# Patient Record
Sex: Male | Born: 1960 | Race: White | Hispanic: No | Marital: Married | Smoking: Never smoker
Health system: Southern US, Community
[De-identification: ages and names within clinical notes are randomized; demographics above are authoritative.]

## PROBLEM LIST (undated history)

## (undated) DIAGNOSIS — E785 Hyperlipidemia, unspecified: Secondary | ICD-10-CM

## (undated) DIAGNOSIS — I2510 Atherosclerotic heart disease of native coronary artery without angina pectoris: Secondary | ICD-10-CM

## (undated) DIAGNOSIS — I10 Essential (primary) hypertension: Secondary | ICD-10-CM

## (undated) HISTORY — DX: Hyperlipidemia, unspecified: E78.5

## (undated) HISTORY — DX: Essential (primary) hypertension: I10

## (undated) HISTORY — DX: Atherosclerotic heart disease of native coronary artery without angina pectoris: I25.10

## (undated) HISTORY — PX: TONSILLECTOMY: SUR1361

## (undated) HISTORY — PX: INGUINAL HERNIA REPAIR: SUR1180

---

## 2004-12-27 ENCOUNTER — Inpatient Hospital Stay (HOSPITAL_COMMUNITY): Admission: EM | Admit: 2004-12-27 | Discharge: 2004-12-27 | Payer: Self-pay | Admitting: Emergency Medicine

## 2005-09-11 ENCOUNTER — Ambulatory Visit (HOSPITAL_COMMUNITY): Admission: RE | Admit: 2005-09-11 | Discharge: 2005-09-11 | Payer: Self-pay | Admitting: General Surgery

## 2017-11-27 ENCOUNTER — Emergency Department (HOSPITAL_COMMUNITY): Payer: BC Managed Care – PPO

## 2017-11-27 ENCOUNTER — Emergency Department (HOSPITAL_COMMUNITY)
Admission: EM | Admit: 2017-11-27 | Discharge: 2017-11-27 | Disposition: A | Discharge status: Home / Self Care | Payer: BC Managed Care – PPO | Attending: Emergency Medicine | Admitting: Emergency Medicine

## 2017-11-27 ENCOUNTER — Encounter (HOSPITAL_COMMUNITY): Payer: Self-pay

## 2017-11-27 DIAGNOSIS — R1031 Right lower quadrant pain: Secondary | ICD-10-CM | POA: Diagnosis present

## 2017-11-27 DIAGNOSIS — Z79899 Other long term (current) drug therapy: Secondary | ICD-10-CM | POA: Diagnosis not present

## 2017-11-27 DIAGNOSIS — R1030 Lower abdominal pain, unspecified: Secondary | ICD-10-CM

## 2017-11-27 LAB — URINALYSIS, ROUTINE W REFLEX MICROSCOPIC
BILIRUBIN URINE: NEGATIVE
Bacteria, UA: NONE SEEN
GLUCOSE, UA: NEGATIVE mg/dL
KETONES UR: NEGATIVE mg/dL
LEUKOCYTES UA: NEGATIVE
Nitrite: NEGATIVE
PROTEIN: NEGATIVE mg/dL
Specific Gravity, Urine: 1.014 (ref 1.005–1.030)
pH: 6 (ref 5.0–8.0)

## 2017-11-27 LAB — CBC
HCT: 44.1 % (ref 39.0–52.0)
Hemoglobin: 14.5 g/dL (ref 13.0–17.0)
MCH: 31.9 pg (ref 26.0–34.0)
MCHC: 32.9 g/dL (ref 30.0–36.0)
MCV: 96.9 fL (ref 80.0–100.0)
NRBC: 0 % (ref 0.0–0.2)
PLATELETS: 242 10*3/uL (ref 150–400)
RBC: 4.55 MIL/uL (ref 4.22–5.81)
RDW: 11.4 % — ABNORMAL LOW (ref 11.5–15.5)
WBC: 7.8 10*3/uL (ref 4.0–10.5)

## 2017-11-27 LAB — LIPASE, BLOOD: Lipase: 32 U/L (ref 11–51)

## 2017-11-27 LAB — COMPREHENSIVE METABOLIC PANEL
ALK PHOS: 45 U/L (ref 38–126)
ALT: 38 U/L (ref 0–44)
ANION GAP: 10 (ref 5–15)
AST: 28 U/L (ref 15–41)
Albumin: 4.4 g/dL (ref 3.5–5.0)
BUN: 16 mg/dL (ref 6–20)
CALCIUM: 9.6 mg/dL (ref 8.9–10.3)
CO2: 25 mmol/L (ref 22–32)
Chloride: 104 mmol/L (ref 98–111)
Creatinine, Ser: 0.66 mg/dL (ref 0.61–1.24)
GFR calc non Af Amer: >60 mL/min (ref >60)
Glucose, Bld: 112 mg/dL — ABNORMAL HIGH (ref 70–99)
Potassium: 3.7 mmol/L (ref 3.5–5.1)
SODIUM: 139 mmol/L (ref 135–145)
TOTAL PROTEIN: 7.4 g/dL (ref 6.5–8.1)
Total Bilirubin: 0.8 mg/dL (ref 0.3–1.2)

## 2017-11-27 MED ORDER — METHOCARBAMOL 500 MG PO TABS: 500.0000 mg | ORAL_TABLET | Freq: Two times a day (BID) | ORAL | 0 refills | Status: AC

## 2017-11-27 NOTE — ED Provider Notes (Signed)
Care assumed from Elpidio Anis, PA-C at shift change with CT pending.   In brief, this patient is a 57 y.o. M with PMH/o HLD, HTN who presents for evaluation of right sided abdominal pain that began yesterday. No associated fevers, nausea/vomiting. Please see note from previous provider for full history/physical.    PLAN: Labs reassuring. Normal GU exam. He is pending a CT renal study. If normal and exam reassuring, can be discharged home.   MDM:  CT renal study shows no obvious kidney stone.  There is mention of a cyst in the anterior mid right kidney.  No evidence of bowel obstruction.  Normal appendix.  Additionally, there is a 4 mm nodular opacity noted in the right lower lobe.  Recommends getting a follow-up CT in 1 year.  Discussed results with patient.  He states he has not had any improvement in pain but states that it is the same.  He denied all offers for pain medications.  He states that the pain is worse with movement and with palpation of the area.  Repeat abdominal exam shows mild tenderness noted in the right abdomen diffusely.  No focal point.  No palpable mass would be concerning for ventral hernia.  He has equal pulses in all 4 extremities.  Pain reproduced with palpation.  At this time, do not suspect dissection as the source of patient's pain.  Discussed results with both patient and wife.  Wife reports that prior to onset of symptoms, patient had been doing a lot of handy work around the house and had been working on a project that required a lot of lifting and movement.  We discussed that this could potentially be a muscle strain given her recent history.  Patient overall is well-appearing and in no acute distress.  Stable for discharge at this time.  We will plan to send home a short course of muscle relaxers to help with pain.  Instructed patient to closely monitor symptoms and return the emergency room for any worsening or concerning symptoms.  He does have an appointment this  primary care doctor next week.  Encouraged him to keep that appointment. Patient had ample opportunity for questions and discussion. All patient's questions were answered with full understanding. Strict return precautions discussed. Patient expresses understanding and agreement to plan.    1. Right lower quadrant abdominal pain   2. Lower abdominal pain       Maxwell Caul, PA-C 11/27/17 0930    Virgina Norfolk, DO 11/27/17 2028

## 2017-11-27 NOTE — ED Notes (Signed)
Pt. Requested not to insert IV if its not necessary . Did not request for any pain med either.

## 2017-11-27 NOTE — ED Triage Notes (Signed)
Pt complains of right lower abdominal pain near the umbilicus, he states the pain was dull yesterday and tonight it has become worse and more constant

## 2017-11-27 NOTE — Discharge Instructions (Addendum)
You can take Tylenol or Ibuprofen as directed for pain. You can alternate Tylenol and Ibuprofen every 4 hours. If you take Tylenol at 1pm, then you can take Ibuprofen at 5pm. Then you can take Tylenol again at 9pm.   Take Robaxin as prescribed. This medication will make you drowsy so do not drive or drink alcohol when taking it.  As we discussed, please follow-up with your primary care doctor as previously scheduled next week.  As we discussed, your CT scan showed a small cyst on the right kidney.  Please follow-up with urology regarding this.  Additionally, there is a 4 mm nodule in the right lower lobe of the lung.  This will need further evaluation from a CT scan in 1 year to measure progression.  As we discussed, closely monitor your symptoms.  Return to emergency department immediately if you start having worsening pain if you have fever, nausea/vomiting, chest pain, difficulty breathing or any other worsening or concerning symptoms.

## 2017-11-27 NOTE — ED Provider Notes (Signed)
[facility]-EMERGENCY DEPT Provider Note   CSN: [id number] Arrival date & time: 11/27/17  0253     History   Chief Complaint Chief Complaint  Patient presents with  . Abdominal Pain    HPI Harry Mcknight is a 57 y.o. male.  Patient with history of HTN, HLD presents with concern for non-radiating RLQ abdominal pain that started yesterday. He states the pain has become progressively worse over time to the point where movement makes the pain worse. No nausea, vomiting or diarrhea. No fever. He states he has been having regular bowel movements per his usual without constipation, and that having a bowel movement this morning did not change the character of the pain. No urinary symptoms or groin pain. No flank or back pain.  The history is provided by the patient and the spouse. No language interpreter was used.  Abdominal Pain   Pertinent negatives include fever, diarrhea, nausea, vomiting and dysuria.    History reviewed. No pertinent past medical history.  There are no active problems to display for this patient.   History reviewed. No pertinent surgical history.      Home Medications    Prior to Admission medications   Medication Sig Start Date End Date Taking? Authorizing Provider  acetaminophen (TYLENOL) 500 MG tablet Take 1,000 mg by mouth every 6 (six) hours as needed for moderate pain.   Yes [provider]  Cholecalciferol (DIALYVITE VITAMIN D 5000) 5000 units capsule Take 5,000 Units by mouth daily.   Yes [provider]  ibuprofen (ADVIL,MOTRIN) 200 MG tablet Take 400 mg by mouth every 6 (six) hours as needed for moderate pain.   Yes [provider]  lisinopril-hydrochlorothiazide (PRINZIDE,ZESTORETIC) 20-12.5 MG tablet Take 1 tablet by mouth daily. 08/30/17  Yes [provider]  metFORMIN (GLUCOPHAGE-XR) 500 MG 24 hr tablet Take 500 mg by mouth 2 (two) times daily. 09/05/17  Yes [provider]    Multiple Vitamin (MULTIVITAMIN WITH MINERALS) TABS tablet Take 1 tablet by mouth daily.   Yes [provider]  Omega-3 Fatty Acids (FISH OIL) 500 MG CAPS Take 1,000 mg by mouth daily.   Yes [provider]  rosuvastatin (CRESTOR) 40 MG tablet Take 40 mg by mouth daily. 09/02/17  Yes [provider]    Family History History reviewed. No pertinent family history.  Social History Social History   Tobacco Use  . Smoking status: Never Smoker  . Smokeless tobacco: Never Used  Substance Use Topics  . Alcohol use: Never    Frequency: Never  . Drug use: Never     Allergies   Patient has no known allergies.   Review of Systems Review of Systems  Constitutional: Negative for appetite change, chills and fever.  HENT: Negative.   Respiratory: Negative.   Cardiovascular: Negative.   Gastrointestinal: Positive for abdominal pain. Negative for diarrhea, nausea and vomiting.  Genitourinary: Negative.  Negative for dysuria and testicular pain.  Musculoskeletal: Negative.   Skin: Negative.   Neurological: Negative.      Physical Exam Updated Vital Signs BP (!) 151/97 (BP Location: Left Arm)   Pulse 68   Temp 98.2 F (36.8 C) (Oral)   Resp 16   SpO2 98%   Physical Exam  Constitutional: He is oriented to person, place, and time. He appears well-developed and well-nourished.  HENT:  Head: Normocephalic.  Neck: Normal range of motion. Neck supple.  Cardiovascular: Normal rate and regular rhythm.  Pulmonary/Chest: Effort normal and breath  sounds normal.  Abdominal: Soft. Bowel sounds are normal. There is no tenderness. There is no rebound and no guarding.  Pain in the RLQ abdomen is unchanged and unaffected by palpation. Abdomen is soft, without mass or distension. BS are active. Thre is no RUQ tenderness.  Genitourinary: Testes normal. Right testis shows no swelling and no tenderness. Left testis shows no swelling and no tenderness.  Musculoskeletal:  Normal range of motion.  Neurological: He is alert and oriented to person, place, and time. No cranial nerve deficit.  Skin: Skin is warm and dry. No rash noted.  Psychiatric: He has a normal mood and affect.  Nursing note and vitals reviewed.    ED Treatments / Results  Labs (all labs ordered are listed, but only abnormal results are displayed) Labs Reviewed  COMPREHENSIVE METABOLIC PANEL - Abnormal; Notable for the following components:      Result Value   Glucose, Bld 112 (*)    All other components within normal limits  CBC - Abnormal; Notable for the following components:   RDW 11.4 (*)    All other components within normal limits  URINALYSIS, ROUTINE W REFLEX MICROSCOPIC - Abnormal; Notable for the following components:   Hgb urine dipstick SMALL (*)    All other components within normal limits  LIPASE, BLOOD    EKG None  Radiology No results found.  Procedures Procedures (including critical care time)  Medications Ordered in ED Medications - No data to display   Initial Impression / Assessment and Plan / ED Course  I have reviewed the triage vital signs and the nursing notes.  Pertinent labs & imaging results that were available during my care of the patient were reviewed by me and considered in my medical decision making (see chart for details).     Patient presents with RLQ abdominal pain concerned for appendicitis. No fever, nausea, vomiting, diarrhea or change in appetite. Pain started yesterday.   The patient has no significant or reproducible tenderness in the abdomen. No leukocytosis, fever, nausea. He does have a small amount of blood in the urine. Doubt appendicitis. DDX: ureteral colic vs constipation. CT scan w/o CM ordered and is pending.   Patient care signed out to Gwendalyn Ege, PA-C, pending CT scan results and patient re-evaluation.  Final Clinical Impressions(s) / ED Diagnoses   Final diagnoses:  None   1. Abdominal pain  ED  Discharge Orders    None       Elpidio Anis, PA-C 11/27/17 6045    Gilda Crease, MD 11/27/17 [phone]

## 2019-04-16 ENCOUNTER — Ambulatory Visit: Payer: BC Managed Care – PPO | Attending: Internal Medicine

## 2019-04-16 DIAGNOSIS — Z23 Encounter for immunization: Secondary | ICD-10-CM

## 2019-04-16 NOTE — Progress Notes (Signed)
   Covid-19 Vaccination Clinic  Name:  Harry Mcknight    MRN: [id number] DOB: Jun 07, 1960  04/16/2019  [name] was observed post Covid-19 immunization for 15 minutes without incident. He was provided with Vaccine Information Sheet and instruction to access the V-Safe system.   [name] was instructed to call 911 with any severe reactions post vaccine: Marland Kitchen Difficulty breathing  . Swelling of face and throat  . A fast heartbeat  . A bad rash all over body  . Dizziness and weakness   Immunizations Administered    Name Date Dose VIS Date Route   Pfizer COVID-19 Vaccine 04/16/2019  9:48 AM 0.3 mL 01/16/2019 Intramuscular   Manufacturer: [employer], [employer]   Lot: [id number]   NDC: [address]-7

## 2019-05-11 ENCOUNTER — Ambulatory Visit: Payer: BC Managed Care – PPO | Attending: Internal Medicine

## 2019-05-11 DIAGNOSIS — Z23 Encounter for immunization: Secondary | ICD-10-CM

## 2019-05-11 NOTE — Progress Notes (Signed)
   Covid-19 Vaccination Clinic  Name:  Harry Mcknight    MRN: [id number] DOB: October 05, 1960  05/11/2019  [name] was observed post Covid-19 immunization for 15 minutes without incident. He was provided with Vaccine Information Sheet and instruction to access the V-Safe system.   [name] was instructed to call 911 with any severe reactions post vaccine: Marland Kitchen Difficulty breathing  . Swelling of face and throat  . A fast heartbeat  . A bad rash all over body  . Dizziness and weakness   Immunizations Administered    Name Date Dose VIS Date Route   Pfizer COVID-19 Vaccine 05/11/2019  9:39 AM 0.3 mL 01/16/2019 Intramuscular   Manufacturer: [employer], [employer]   Lot: [id number]   NDC: [address]-0

## 2020-02-13 IMAGING — CT CT RENAL STONE PROTOCOL
2 of 4 series · 15 of 46 positions shown, 17 images · non-contrast
Comparison: None.

CLINICAL DATA: Abdominal pain with microhematuria

EXAM:
CT ABDOMEN AND PELVIS WITHOUT CONTRAST
TECHNIQUE: Multidetector CT imaging of the abdomen and pelvis was performed
following the standard protocol without oral or IV contrast.

[Series 2: axial st · axial · 0.71mm/px · z∈[+1159,+1569]mm · 12 of 92 slices shown, 14 images]
[im 5/92  soft-tissue]
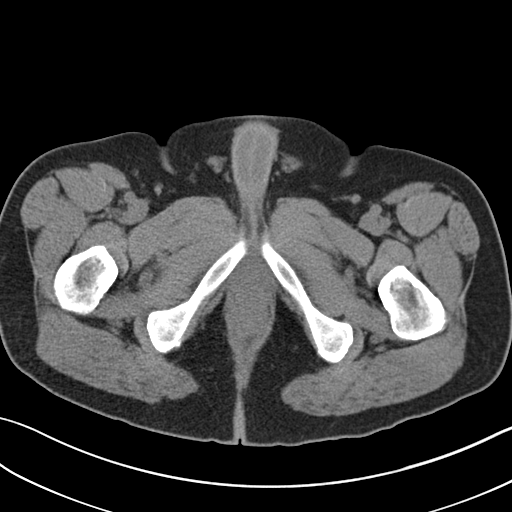
[im 5/92  bone]
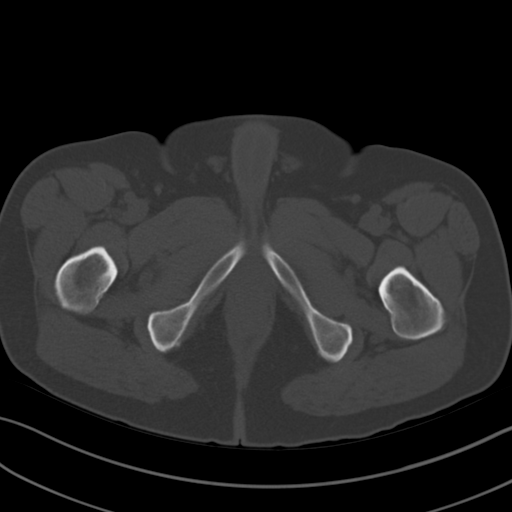
[im 15/92  soft-tissue]
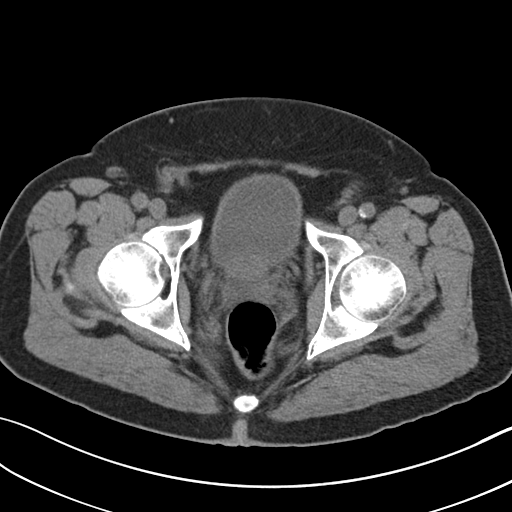
[im 20/92  soft-tissue]
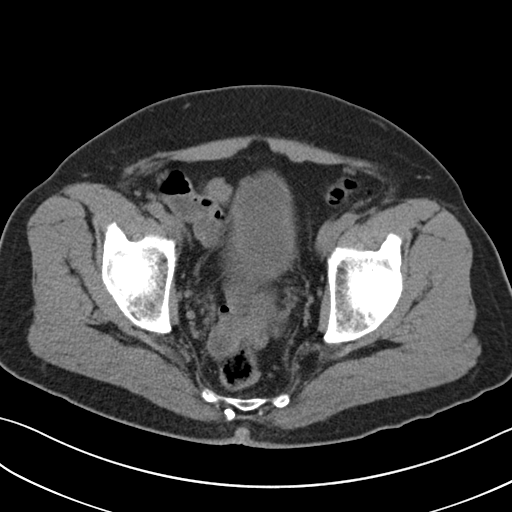
[im 29/92  soft-tissue]
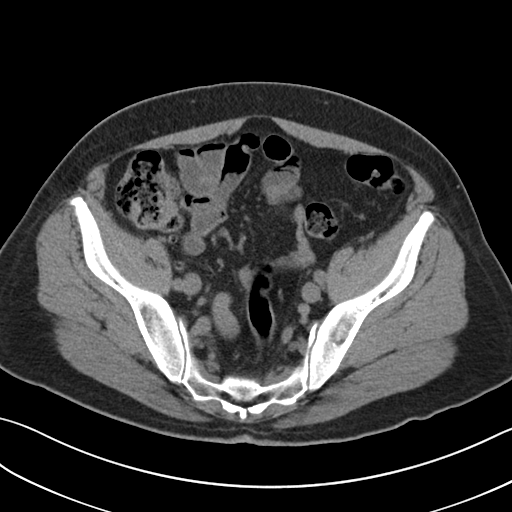
[im 34/92  soft-tissue]
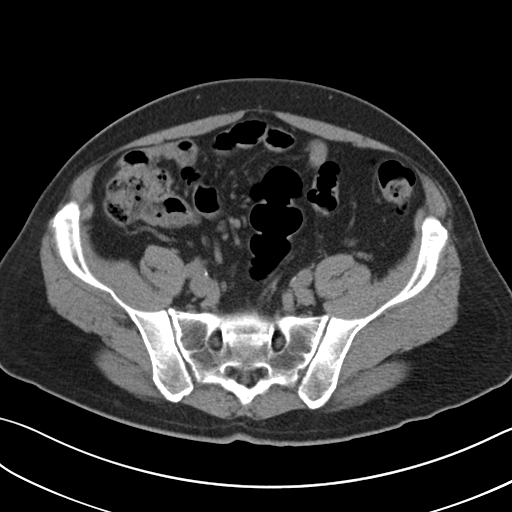
[im 44/92  soft-tissue]
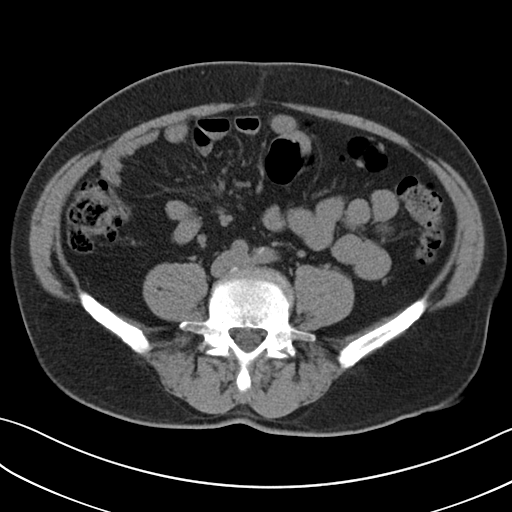
[im 48/92  soft-tissue]
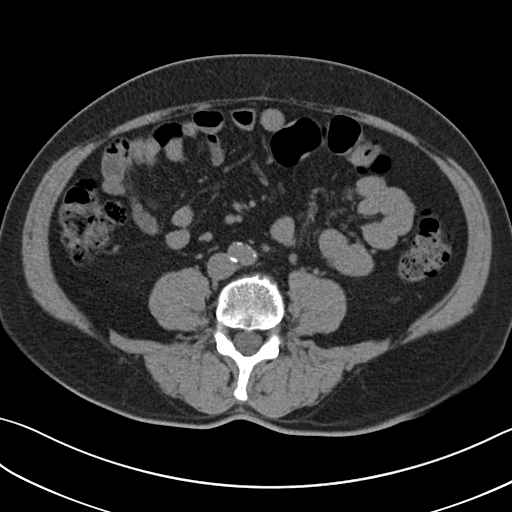
[im 58/92  soft-tissue]
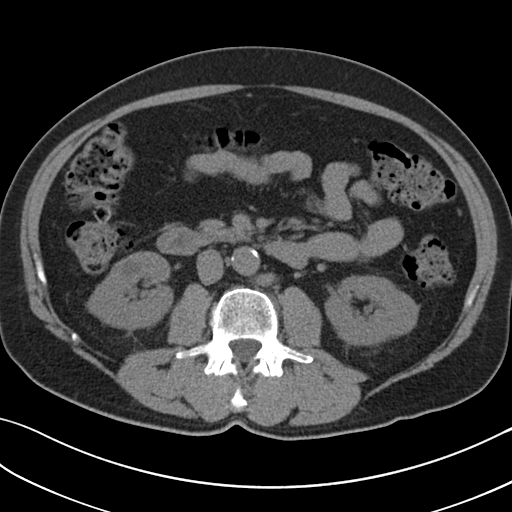
[im 63/92  soft-tissue]
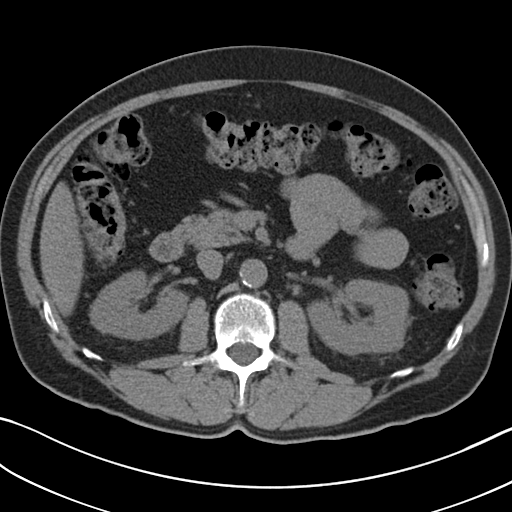
[im 63/92  bone]
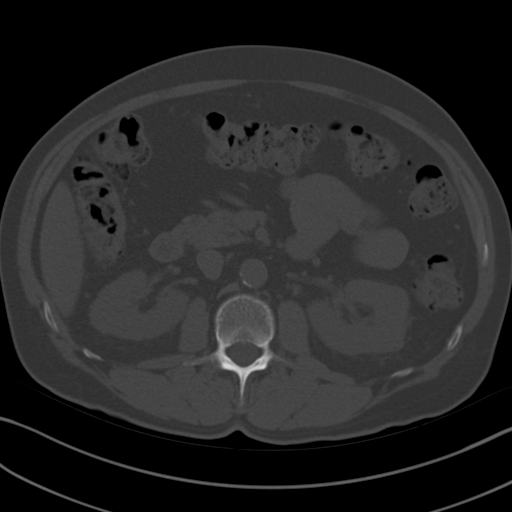
[im 72/92  soft-tissue]
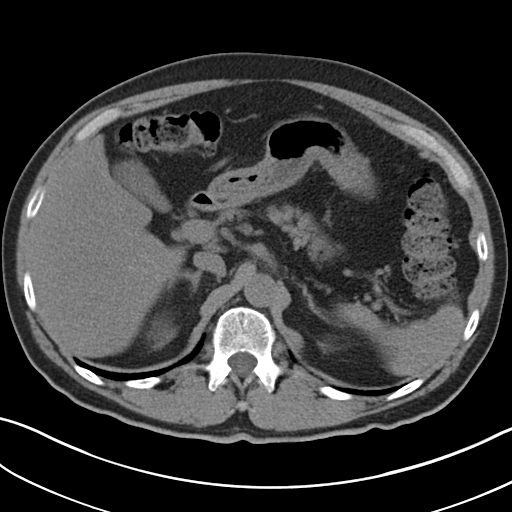
[im 77/92  soft-tissue]
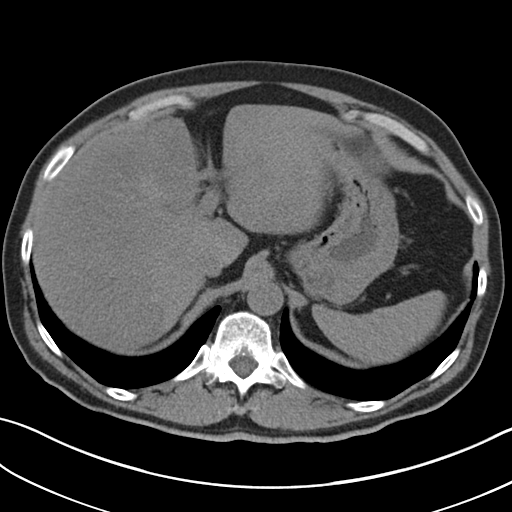
[im 87/92  soft-tissue]
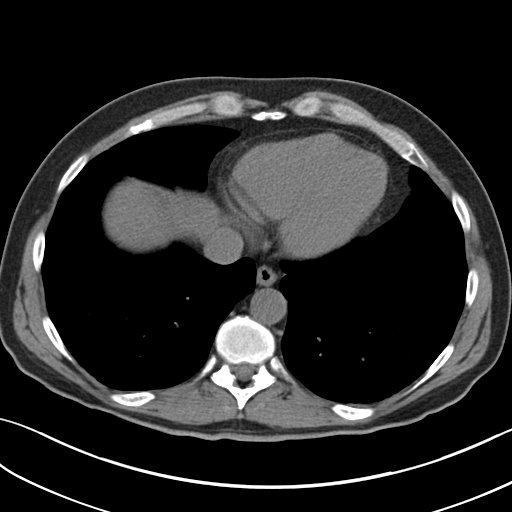

[Series 4: coronal · coronal · 0.71mm/px · 3 of 135 slices shown]
[im 45/135  soft-tissue]
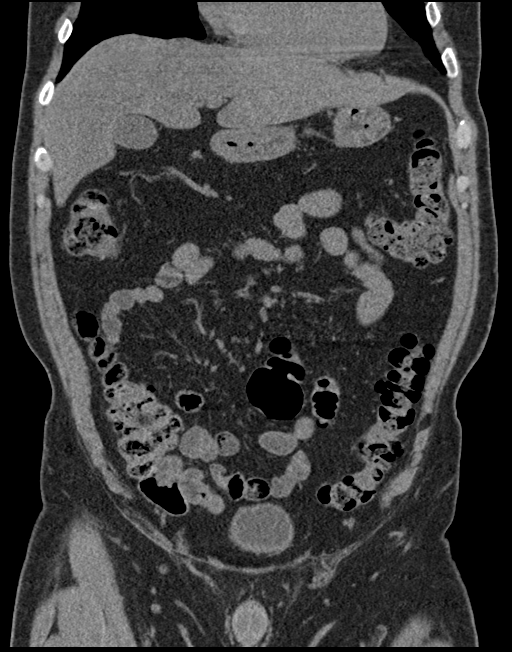
[im 60/135  soft-tissue]
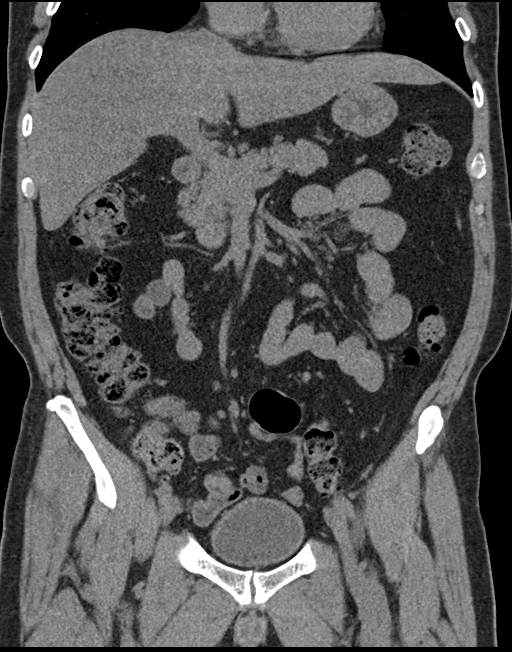
[im 75/135  soft-tissue]
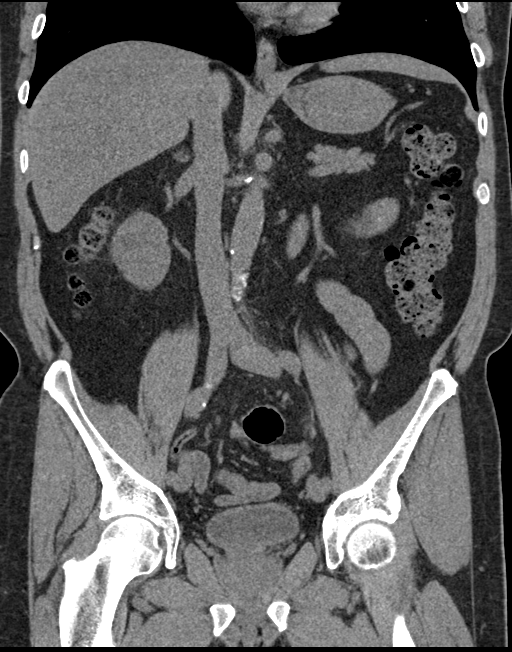

[15 of 46 positions shown; findings below may reference images not displayed]

FINDINGS: Lower chest: There is a 4 mm nodular opacity in the lateral segment
right lower lobe. Lung bases otherwise are clear.

Hepatobiliary: There is hepatic steatosis. No focal liver lesions
are evident on this noncontrast enhanced study. Gallbladder wall is
not appreciably thickened. There is no biliary duct dilatation.

Pancreas: No pancreatic mass or inflammatory focus.

Spleen: No splenic lesions are evident.

Adrenals/Urinary Tract: Adrenals bilaterally appear unremarkable.
There is a cyst in the anterior mid right kidney measuring 1.2 x
cm. There is no evident hydronephrosis on either side. There is no
renal or ureteral calculus on either side. Urinary bladder is
midline with wall thickness within normal limits.

Stomach/Bowel: There is no appreciable bowel wall or mesenteric
thickening. There is no evident bowel obstruction. There is no free
air or portal venous air.

Vascular/Lymphatic: There is atherosclerotic calcification in the
aorta and common iliac arteries. No aneurysm evident. Major
mesenteric arterial vessels appear patent. No adenopathy is
appreciable in the abdomen or pelvis.

Reproductive: Prostate and seminal vesicles appear normal in size
and contour. No evident pelvic mass. There are several small
prostatic calculi.

Other: Appendix appears normal. No abscess or ascites is evident in
the abdomen or pelvis.

Musculoskeletal: There is degenerative change in the lumbar spine
with severe disc space narrowing at L5-S1. There are no blastic or
lytic bone lesions. There is no intramuscular or abdominal wall
lesion evident.
IMPRESSION: 1. No evident renal or ureteral calculus. No hydronephrosis. There
are a few small prostatic calculi.

2. No demonstrable bowel obstruction. No abscess in the abdomen or
pelvis. Appendix appears normal.

3. 4 mm nodular opacity right lower lobe. No follow-up needed if
patient is low-risk. Non-contrast chest CT can be considered in 12
months if patient is high-risk. This recommendation follows the
consensus statement: Guidelines for Management of Incidental
Pulmonary Nodules Detected on CT Images: From the [HOSPITAL]

4. Hepatic steatosis. No focal liver lesions evident on this
noncontrast enhanced study.

5.  There is aortoiliac atherosclerosis.

Aortic Atherosclerosis (JXAAP-A2F.F).

## 2023-01-28 ENCOUNTER — Other Ambulatory Visit: Payer: Self-pay | Admitting: Family Medicine

## 2023-01-28 DIAGNOSIS — Z8249 Family history of ischemic heart disease and other diseases of the circulatory system: Secondary | ICD-10-CM

## 2023-02-07 ENCOUNTER — Ambulatory Visit
Admission: RE | Admit: 2023-02-07 | Discharge: 2023-02-07 | Disposition: A | Discharge status: Home / Self Care | Payer: No Typology Code available for payment source | Source: Ambulatory Visit | Attending: Family Medicine | Admitting: Family Medicine

## 2023-02-07 DIAGNOSIS — Z8249 Family history of ischemic heart disease and other diseases of the circulatory system: Secondary | ICD-10-CM

## 2023-05-16 NOTE — Progress Notes (Unsigned)
 Cardiology Office Note:  .   Date:  05/17/2023  ID:  Harry Mcknight, DOB 05-08-1960, MRN [id number] PCP: Harry Screws, MD  [facility] Providers Cardiologist:  None { History of Present Illness: .    Chief Complaint  Patient presents with   Coronary Artery Disease         Harry Mcknight is a 63 y.o. male with history of CAD who presents for the evaluation of CAD at the request of Harry Screws, MD.   History of Present Illness   Harry Mcknight is a 63 year old male with hypertension, hyperlipidemia, and prediabetes who presents with an elevated coronary calcium score. He was referred by Dr. Abigail Mcknight for evaluation of his elevated coronary calcium score.  He has an elevated coronary calcium score of 953, placing him in the 95th percentile for men of his age. He is asymptomatic with normal EKG findings. He experiences intermittent episodes of chest discomfort described as a 'little tingling' in the upper chest, sometimes associated with blood sugar fluctuations or anxiety. No chest pain or debilitating symptoms occur during physical activities such as power walking, biking, or mowing the lawn.  He has a history of hypertension with home readings around 120/80 mmHg and experiences 'white coat hypertension' during doctor visits. He is on medication for this condition, though specific medications were not discussed.  For hyperlipidemia, he is on Crestor (rosuvastatin) 40 mg daily. His most recent LDL was 71 mg/dL. He follows a low-carb diet, avoids adding salt, and does not consume sugared sodas. He occasionally consumes red meat and maintains a diet that includes [name], cheese, vegetables, and yogurt.  He is prediabetic and is being treated to maintain appropriate blood sugar levels.  His family history is significant for heart disease; his father passed away at nearly 61 years old from heart failure and had several stents placed. He has a twin brother with more health  issues and a younger brother and sister.  Socially, he is a professor of voice and music at [employer] and was previously an Production designer, theatre/television/film for 17 years. He is married, does not smoke, consumes alcohol, and engages in regular physical activity with his wife, including power walking and biking.          Problem List CAD -CAC 953 (95th percentile) 2. HLD -T chol 168, HDL 84, LDL 71, TG 72 3. HTN 4. Pre-DM    ROS: All other ROS reviewed and negative. Pertinent positives noted in the HPI.     Studies Reviewed: Marland Kitchen   EKG Interpretation Date/Time:  Friday May 17 2023 09:08:28 EDT Ventricular Rate:  73 PR Interval:  174 QRS Duration:  94 QT Interval:  376 QTC Calculation: 414 R Axis:   -1  Text Interpretation: Normal sinus rhythm Minimal voltage criteria for LVH, may be normal variant ( R in aVL ) Confirmed by Harry Mcknight [phone]) on 05/17/2023 9:38:40 AM   CAC 02/28/2023  IMPRESSION: 1. Patient's total coronary artery calcium score is 953 which is 95th percentile for patient's of matched age, gender and race/ethnicity. Please note that although the presence of coronary artery calcium documents the presence of coronary artery disease, the severity of this disease and any potential stenosis cannot be assessed on this noncontrast CT examination. Assessment for potential risk factor modification, dietary therapy or pharmacologic therapy may be warranted, if clinically indicated. 2. Hepatic steatosis. Physical Exam:   VS:  BP (!) 158/94 (BP Location: Left Arm, Patient Position: Sitting,  Cuff Size: Normal)   Pulse 73   Ht 6' 1.5" (1.867 m)   Wt 198 lb (89.8 kg)   BMI 25.77 kg/m    Wt Readings from Last 3 Encounters:  05/17/23 198 lb (89.8 kg)    GEN: Well nourished, well developed in no acute distress NECK: No JVD; No carotid bruits CARDIAC: RRR, no murmurs, rubs, gallops RESPIRATORY:  Clear to auscultation without rales, wheezing or rhonchi  ABDOMEN: Soft,  non-tender, non-distended EXTREMITIES:  No edema; No deformity  ASSESSMENT AND PLAN: .   Assessment and Plan    Elevated coronary calcium score Chest pain Calcium score of 953 indicates high risk for obstructive CAD. Normal EKG and non-cardiac symptoms, but further evaluation needed due to high calcium score. - EKG normal but describes chest discomfort occasionally  - Order coronary CT angiography (CTA) with contrast. - Administer 100 mg metoprolol tartrate before scan. - Start 81 mg aspirin daily.  Hyperlipidemia LDL cholesterol at 71 mg/dL, above target of <16 mg/dL due to high calcium score. HDL favorable at 84 mg/dL. Rosuvastatin at max dose; additional therapy needed to lower LDL. - Continue rosuvastatin 40 mg daily. - Start Zetia (ezetimibe) 10 mg daily. - repeat lipids and LPA in 6 months   Hypertension Well-controlled at home, no changes needed. - white coat noted              Follow-up: Return in about 6 months (around 11/16/2023).   Signed, Harry Mcknight. Harry Lipps, MD, [facility]  [facility]  [address], [address] [place], [place] [address] [phone]  10:27 AM

## 2023-05-17 ENCOUNTER — Ambulatory Visit: Payer: 59 | Attending: Cardiovascular Disease | Admitting: Cardiovascular Disease

## 2023-05-17 ENCOUNTER — Encounter: Payer: Self-pay | Admitting: Cardiovascular Disease

## 2023-05-17 VITALS — BP 158/94 | HR 73 | Ht 73.5 in | Wt 198.0 lb

## 2023-05-17 DIAGNOSIS — R079 Chest pain, unspecified: Secondary | ICD-10-CM | POA: Diagnosis not present

## 2023-05-17 DIAGNOSIS — R931 Abnormal findings on diagnostic imaging of heart and coronary circulation: Secondary | ICD-10-CM | POA: Diagnosis not present

## 2023-05-17 DIAGNOSIS — E782 Mixed hyperlipidemia: Secondary | ICD-10-CM | POA: Diagnosis not present

## 2023-05-17 DIAGNOSIS — I2510 Atherosclerotic heart disease of native coronary artery without angina pectoris: Secondary | ICD-10-CM

## 2023-05-17 DIAGNOSIS — I150 Renovascular hypertension: Secondary | ICD-10-CM

## 2023-05-17 DIAGNOSIS — Z01812 Encounter for preprocedural laboratory examination: Secondary | ICD-10-CM

## 2023-05-17 LAB — BASIC METABOLIC PANEL WITH GFR
BUN/Creatinine Ratio: 16 (ref 10–24)
BUN: 12 mg/dL (ref 8–27)
CO2: 20 mmol/L (ref 20–29)
Calcium: 10 mg/dL (ref 8.6–10.2)
Chloride: 98 mmol/L (ref 96–106)
Creatinine, Ser: 0.76 mg/dL (ref 0.76–1.27)
Glucose: 119 mg/dL — ABNORMAL HIGH (ref 70–99)
Potassium: 4.2 mmol/L (ref 3.5–5.2)
Sodium: 137 mmol/L (ref 134–144)
eGFR: 102 mL/min/1.73

## 2023-05-17 MED ORDER — METOPROLOL TARTRATE 100 MG PO TABS: 100.0000 mg | ORAL_TABLET | Freq: Once | ORAL | 0 refills | Status: AC

## 2023-05-17 MED ORDER — EZETIMIBE 10 MG PO TABS: 10.0000 mg | ORAL_TABLET | Freq: Every day | ORAL | 3 refills | Status: AC

## 2023-05-17 MED ORDER — ASPIRIN 81 MG PO TBEC: 81.0000 mg | DELAYED_RELEASE_TABLET | Freq: Every day | ORAL | Status: AC

## 2023-05-17 NOTE — Patient Instructions (Addendum)
 Medication Instructions:  START ASPIRIN 81 MG BY MOUTH DAILY  START ezetimibe (ZETIA) 10 MG BY MOUTH DAILY    *If you need a refill on your cardiac medications before your next appointment, please call your pharmacy*   Lab Work: Your physician recommends that you return for lab work in 6 MONTHS  LIPID PANEL  LIPOPROTEIN A      If you have labs (blood work) drawn today and your tests are completely normal, you will receive your results only by: MyChart Message (if you have MyChart) OR A paper copy in the mail If you have any lab test that is abnormal or we need to change your treatment, we will call you to review the results.   Testing/Procedures:   Your cardiac CT will be scheduled at one of the below locations:   [facility] [address] [place], [place] [address] [phone]   If scheduled at [facility], please arrive at the [facility] and Children's Entrance (Entrance C2) of [facility] 30 minutes prior to test start time. You can use the FREE valet parking offered at entrance C (encouraged to control the heart rate for the test)  Proceed to the [facility] Radiology Department (first floor) to check-in and test prep.  All radiology patients and guests should use entrance C2 at [facility], accessed from [facility], even though the hospital's physical address listed is [address]     Please follow these instructions carefully (unless otherwise directed):  An IV will be required for this test and Nitroglycerin will be given.  Hold all erectile dysfunction medications at least 3 days (72 hrs) prior to test. (Ie viagra, cialis, sildenafil, tadalafil, etc)   On the Night Before the Test: Be sure to Drink plenty of water. Do not consume any caffeinated/decaffeinated beverages or chocolate 12 hours prior to your test. Do not take any antihistamines 12 hours prior to your test. If the patient has contrast  allergy: Patient will need a prescription for Prednisone and very clear instructions (as follows): Prednisone 50 mg - take 13 hours prior to test Take another Prednisone 50 mg 7 hours prior to test Take another Prednisone 50 mg 1 hour prior to test Take Benadryl 50 mg 1 hour prior to test Patient must complete all four doses of above prophylactic medications. Patient will need a ride after test due to Benadryl.  On the Day of the Test: Drink plenty of water until 1 hour prior to the test. Do not eat any food 1 hour prior to test. You may take your regular medications prior to the test.  Take metoprolol (Lopressor) two hours prior to test. If you take lisinopril-hydrochlorothiazide (PRINZIDE,ZESTORETIC) 20-12.5 MG tablet , please HOLD on the morning of the test. Patients who wear a continuous glucose monitor MUST remove the device prior to scanning.        After the Test: Drink plenty of water. After receiving IV contrast, you may experience a mild flushed feeling. This is normal. On occasion, you may experience a mild rash up to 24 hours after the test. This is not dangerous. If this occurs, you can take Benadryl 25 mg, Zyrtec, Claritin, or Allegra and increase your fluid intake. (Patients taking Tikosyn should avoid Benadryl, and may take Zyrtec, Claritin, or Allegra) If you experience trouble breathing, this can be serious. If it is severe call 911 IMMEDIATELY. If it is mild, please call our office.  We will call  to schedule your test 2-4 weeks out understanding that some insurance companies will need an authorization prior to the service being performed.   For more information and frequently asked questions, please visit our website : [email or web address]  For non-scheduling related questions, please contact the cardiac imaging nurse navigator should you have any questions/concerns: Cardiac Imaging Nurse Navigators Direct Office Dial: [phone]   For scheduling  needs, including cancellations and rescheduling, please call [place], [phone].    Follow-Up: At [facility], you and your health needs are our priority.  As part of our continuing mission to provide you with exceptional heart care, we have created designated Provider Care Teams.  These Care Teams include your primary Cardiologist (physician) and Advanced Practice Providers (APPs -  Physician Assistants and Nurse Practitioners) who all work together to provide you with the care you need, when you need it.  We recommend signing up for the patient portal called "MyChart".  Sign up information is provided on this After Visit Summary.  MyChart is used to connect with patients for Virtual Visits (Telemedicine).  Patients are able to view lab/test results, encounter notes, upcoming appointments, etc.  Non-urgent messages can be sent to your provider as well.   To learn more about what you can do with MyChart, go to [email or web address].    Your next appointment:   6 month(s)  The format for your next appointment:   In Person  Provider:   Lennie Odor, MD     Other Instructions

## 2023-05-20 ENCOUNTER — Encounter: Payer: Self-pay | Admitting: Cardiovascular Disease

## 2023-06-10 ENCOUNTER — Encounter (HOSPITAL_COMMUNITY): Payer: Self-pay

## 2023-06-12 ENCOUNTER — Ambulatory Visit (HOSPITAL_COMMUNITY)
Admission: RE | Admit: 2023-06-12 | Discharge: 2023-06-12 | Disposition: A | Discharge status: Home / Self Care | Source: Ambulatory Visit | Attending: Cardiology | Admitting: Cardiology

## 2023-06-12 ENCOUNTER — Other Ambulatory Visit: Payer: Self-pay | Admitting: Cardiology

## 2023-06-12 ENCOUNTER — Ambulatory Visit (HOSPITAL_COMMUNITY)
Admission: RE | Admit: 2023-06-12 | Discharge: 2023-06-12 | Disposition: A | Discharge status: Home / Self Care | Source: Ambulatory Visit | Attending: Cardiovascular Disease | Admitting: Cardiovascular Disease

## 2023-06-12 DIAGNOSIS — R079 Chest pain, unspecified: Secondary | ICD-10-CM | POA: Insufficient documentation

## 2023-06-12 DIAGNOSIS — R931 Abnormal findings on diagnostic imaging of heart and coronary circulation: Secondary | ICD-10-CM | POA: Insufficient documentation

## 2023-06-12 DIAGNOSIS — I2510 Atherosclerotic heart disease of native coronary artery without angina pectoris: Secondary | ICD-10-CM

## 2023-06-12 MED ORDER — NITROGLYCERIN 0.4 MG SL SUBL
SUBLINGUAL_TABLET | SUBLINGUAL | Status: AC
  Filled 2023-06-12: qty 2

## 2023-06-12 MED ORDER — NITROGLYCERIN 0.4 MG SL SUBL
0.8000 mg | SUBLINGUAL_TABLET | Freq: Once | SUBLINGUAL | Status: AC
  Administered 2023-06-12: 0.8 mg via SUBLINGUAL

## 2023-06-12 MED ORDER — IOHEXOL 350 MG/ML SOLN
100.0000 mL | Freq: Once | INTRAVENOUS | Status: AC | PRN
  Administered 2023-06-12: 100 mL via INTRAVENOUS

## 2023-06-13 ENCOUNTER — Encounter: Payer: Self-pay | Admitting: Cardiovascular Disease

## 2023-06-19 ENCOUNTER — Ambulatory Visit: Payer: Self-pay

## 2023-12-09 ENCOUNTER — Encounter: Payer: Self-pay | Admitting: Cardiovascular Disease

## 2024-02-04 LAB — LAB REPORT - SCANNED
Creatinine, POC: 91 mg/dL
Microalb Creat Ratio: 45.3
Microalbumin, Urine: 4.12

## 2024-02-04 LAB — HEMOGLOBIN A1C: A1c: 6.6

## 2024-02-04 LAB — COMPREHENSIVE METABOLIC PANEL WITH GFR: EGFR: 102

## 2024-02-07 ENCOUNTER — Ambulatory Visit: Payer: Self-pay | Admitting: Cardiovascular Disease

## 2024-02-17 NOTE — Progress Notes (Unsigned)
 " Cardiology Office Note:  .   Date:  02/21/2024  ID:  Harry Mcknight, DOB 1960-07-08, MRN [id number] PCP: Frederik Lamar, MD  [facility] Providers Cardiologist:  None   History of Present Illness: .    Chief Complaint  Patient presents with   Follow-up    Harry Mcknight is a 64 y.o. male with below history who presents for follow-up.   History of Present Illness   Harry Mcknight is a 64 year old male with elevated coronary calcium score, nonobstructive coronary artery disease, hypertension, and hyperlipidemia who presents for follow-up.  He has experienced occasional 'flutter' sensations in his chest during periods of anxiety or stress, but no chest pain or tightness. These sensations do not occur during physical activities such as exercising or yard work.  He has a history of elevated coronary calcium score, with a recent coronary CTA in May. He is currently on aspirin , Crestor 40 mg, and Zetia  10 mg. His most recent LDL level is 60.  He has hypertension, described as 'white coat hypertension.' Home blood pressure monitoring typically shows readings in the 120s/80s to 130s/80s, but it tends to rise to 150s in clinical settings.  He is prediabetic with an A1c of 6.6. His metformin dosage was recently increased by 500 mg at dinnertime, and his blood glucose levels will be rechecked in two months.  His family history is significant for heart disease. His father passed away from heart failure at the age of 40, having had stents and a pacemaker. His mother had high blood pressure, and he has a twin brother with similar health issues.  He works at [employer] in [place] and maintains an active lifestyle, engaging in activities such as power walking, biking, and yard work. He consumes alcohol in moderation, preferring white wine and low-carb beer.           Problem List CAD -CAC 1027 (95th percentile) -LAD <25%; RCA <25%; OM1 50-69%; negative CT FFR 2. HLD -T chol  161, HDL 90, LDL 60, TG 57 3. HTN 4. Pre-DM -A1c 6.6 5. PhD [employer]     ROS: All other ROS reviewed and negative. Pertinent positives noted in the HPI.     Studies Reviewed: .       CT FFR 15-Jun-2023 1. Left Main: FFR = 0.99   2. LAD: Proximal FFR = 0.98, mid FFR = 0.96, distal FFR = 0.91 3. LCX: Proximal FFR = 0.98, distal FFR = 0.97 4. RCA: Proximal FFR = 0.97, mid FFR =0.96, distal FFR = 0.94  CCTA 06/23/2023 IMPRESSION: 1. Coronary calcium score of 1027. This was 95 percentile for age and sex matched control.   2. Normal coronary origin with right dominance.   3. CAD-RADS 3. Moderate stenosis. Consider symptom-guided anti-ischemic pharmacotherapy as well as risk factor modification per guideline directed care. Additional analysis with CT FFR will be submitted.   4. Total plaque volume 814 mm3 which is 76th percentile for age- and sex-matched controls (calcified plaque 211 mm3; non-calcified plaque 603 mm3, Low attenuation 7 mm3). TPV is extensive.   Physical Exam:   VS:  BP (!) 151/93 (BP Location: Left Arm, Patient Position: Sitting)   Pulse 76   Ht 6' 1 (1.854 m)   Wt 199 lb (90.3 kg)   SpO2 98%   BMI 26.25 kg/m    Wt Readings from Last 3 Encounters:  02/21/24 199 lb (90.3 kg)  05/17/23 198 lb (89.8 kg)  GEN: Well nourished, well developed in no acute distress NECK: No JVD; No carotid bruits CARDIAC: RRR, no murmurs, rubs, gallops RESPIRATORY:  Clear to auscultation without rales, wheezing or rhonchi  ABDOMEN: Soft, non-tender, non-distended EXTREMITIES:  No edema; No deformity  ASSESSMENT AND PLAN: .   Assessment and Plan    Nonobstructive coronary artery disease Elevated coronary calcium score of 1027 in the 95th percentile. Coronary CTA diffuse moderate OM1 disease with negative CT FFR. Managed preventively to prevent further calcification and progression of noncalcified plaque. Occasional chest fluttering during stress, no angina during  physical activity. Noncalcified plaque is unpredictable; calcified plaque is stable. - Continue aspirin  81 mg. - Continue Crestor 40 mg and Zetia  10 mg. - Encouraged lifestyle modifications including diet and exercise. - Scheduled follow-up in one year unless symptoms change.  Mixed hyperlipidemia LDL cholesterol controlled at 60 mg/dL. Crestor and Zetia  well-tolerated. High HDL levels beneficial. Goal is to maintain cholesterol levels to prevent noncalcified plaque progression. - Continue Crestor 40 mg and Zetia  10 mg. - Encouraged dietary modifications to maintain cholesterol levels.  Essential hypertension Elevated blood pressure in clinical setting likely due to white coat hypertension. Home readings acceptable. Proactive in monitoring blood pressure with satisfactory reports to primary care provider. - Continue home blood pressure monitoring. - Maintain current lifestyle and dietary habits to manage blood pressure.                Follow-up: Return in about 1 year (around 02/20/2025).  Signed, Darryle DASEN. Barbaraann, MD, [facility]  [facility]  [address] [place], [place] [address] [phone]  9:24 AM   "

## 2024-02-21 ENCOUNTER — Ambulatory Visit: Admitting: Cardiovascular Disease

## 2024-02-21 ENCOUNTER — Encounter: Payer: Self-pay | Admitting: Cardiovascular Disease

## 2024-02-21 VITALS — BP 151/93 | HR 76 | Ht 73.0 in | Wt 199.0 lb

## 2024-02-21 DIAGNOSIS — E782 Mixed hyperlipidemia: Secondary | ICD-10-CM

## 2024-02-21 DIAGNOSIS — I2510 Atherosclerotic heart disease of native coronary artery without angina pectoris: Secondary | ICD-10-CM | POA: Diagnosis not present

## 2024-02-21 NOTE — Patient Instructions (Signed)
   Follow-Up: At [facility], you and your health needs are our priority.  As part of our continuing mission to provide you with exceptional heart care, our providers are all part of one team.  This team includes your primary Cardiologist (physician) and Advanced Practice Providers or APPs (Physician Assistants and Nurse Practitioners) who all work together to provide you with the care you need, when you need it.  Your next appointment:   12 month(s)  Provider:   One of our Advanced Practice Providers (APPs): Morse Clause, PA-C  Lamarr Satterfield, NP Miriam Shams, NP  Olivia Pavy, PA-C Josefa Beauvais, NP  Leontine Salen, PA-C Orren Fabry, PA-C  [place], PA-C Ernest Dick, NP  Damien Braver, NP Jon Hails, PA-C  Waddell Donath, PA-C    Dayna Dunn, PA-C  Scott Weaver, PA-C Lum Louis, NP Katlyn West, NP Callie Goodrich, PA-C  Xika Zhao, NP Sheng Haley, PA-C    Kathleen Johnson, PA-C
